# Patient Record
Sex: Female | Born: 1975 | Race: White | Hispanic: No | Marital: Married | Smoking: Never smoker
Health system: Southern US, Community
[De-identification: ages and names within clinical notes are randomized; demographics above are authoritative.]

## PROBLEM LIST (undated history)

## (undated) HISTORY — PX: NO PAST SURGERIES: SHX2092

## (undated) HISTORY — PX: RHINOPLASTY: SUR1284

## (undated) HISTORY — PX: AUGMENTATION MAMMAPLASTY: SUR837

## (undated) HISTORY — PX: BREAST ENHANCEMENT SURGERY: SHX7

---

## 2010-03-30 NOTE — L&D Delivery Note (Signed)
Delivery Note At 6:28 AM a viable female was delivered via Vaginal, Spontaneous Delivery (Presentation: Right Occiput Anterior).  APGAR: 9, 9; weight .   Placenta status: , .  Cord: 3 vessels with the following complications: None.  Cord pH: NONE  Anesthesia: Epidural  Episiotomy: None Lacerations: 2nd degree;Perineal Suture Repair: 2.0 Est. Blood Loss (mL): 400CC  Mom to postpartum.  Baby to nursery-stable.  Kenni Newton S 11/02/2010, 6:42 AM

## 2010-04-24 LAB — RPR: RPR: NONREACTIVE

## 2010-04-28 LAB — RUBELLA ANTIBODY, IGM: Rubella: IMMUNE

## 2010-04-28 LAB — HIV ANTIBODY (ROUTINE TESTING W REFLEX): HIV: NONREACTIVE

## 2010-04-28 LAB — ABO/RH: "RH Type ": POSITIVE

## 2010-04-28 LAB — SYPHILIS: RPR W/REFLEX TO RPR TITER AND TREPONEMAL ANTIBODIES, TRADITIONAL SCREENING AND DIAGNOSIS ALGORITHM: RPR: NONREACTIVE

## 2010-11-02 ENCOUNTER — Inpatient Hospital Stay (HOSPITAL_COMMUNITY)
Admission: AD | Admit: 2010-11-02 | Discharge: 2010-11-03 | DRG: 775 | Disposition: A | Discharge status: Home / Self Care | Payer: Managed Care, Other (non HMO) | Source: Ambulatory Visit | Attending: Obstetrics and Gynecology | Admitting: Obstetrics and Gynecology

## 2010-11-02 ENCOUNTER — Inpatient Hospital Stay (HOSPITAL_COMMUNITY): Payer: Managed Care, Other (non HMO) | Admitting: Anesthesiology

## 2010-11-02 ENCOUNTER — Encounter (HOSPITAL_COMMUNITY): Payer: Self-pay | Admitting: *Deleted

## 2010-11-02 ENCOUNTER — Encounter (HOSPITAL_COMMUNITY): Payer: Self-pay | Admitting: Anesthesiology

## 2010-11-02 LAB — CBC
HCT: 34.6 % — ABNORMAL LOW (ref 36.0–46.0)
Hemoglobin: 12.2 g/dL (ref 12.0–15.0)
MCV: 90.6 fL (ref 78.0–100.0)
Platelets: 134 10*3/uL — ABNORMAL LOW (ref 150–400)
RBC: 3.82 MIL/uL — ABNORMAL LOW (ref 3.87–5.11)
RDW: 12.4 % (ref 11.5–15.5)
WBC: 11.4 10*3/uL — ABNORMAL HIGH (ref 4.0–10.5)

## 2010-11-02 LAB — RPR: RPR Ser Ql: NONREACTIVE

## 2010-11-02 MED ORDER — IBUPROFEN 600 MG PO TABS: 600.0000 mg | ORAL_TABLET | Freq: Four times a day (QID) | ORAL | Status: DC | PRN

## 2010-11-02 MED ORDER — PHENYLEPHRINE 40 MCG/ML (10ML) SYRINGE FOR IV PUSH (FOR BLOOD PRESSURE SUPPORT)
PREFILLED_SYRINGE | INTRAVENOUS | Status: AC
  Filled 2010-11-02: qty 5

## 2010-11-02 MED ORDER — PRENATAL PLUS 27-1 MG PO TABS
1.0000 | ORAL_TABLET | Freq: Every day | ORAL | Status: DC
  Administered 2010-11-02 – 2010-11-03 (×2): 1 via ORAL
  Filled 2010-11-02 (×2): qty 1

## 2010-11-02 MED ORDER — FLEET ENEMA 7-19 GM/118ML RE ENEM: 1.0000 | ENEMA | RECTAL | Status: DC | PRN

## 2010-11-02 MED ORDER — LACTATED RINGERS IV SOLN
500.0000 mL | INTRAVENOUS | Status: DC | PRN
  Administered 2010-11-02: 1000 mL via INTRAVENOUS

## 2010-11-02 MED ORDER — FENTANYL 2.5 MCG/ML BUPIVACAINE 1/10 % EPIDURAL INFUSION (WH - ANES)
14.0000 mL/h | INTRAMUSCULAR | Status: DC
  Administered 2010-11-02: 14 mL/h via EPIDURAL

## 2010-11-02 MED ORDER — OXYTOCIN 20 UNITS IN LACTATED RINGERS INFUSION - SIMPLE: 125.0000 mL/h | INTRAVENOUS | Status: DC | PRN

## 2010-11-02 MED ORDER — TETANUS-DIPHTH-ACELL PERTUSSIS 5-2.5-18.5 LF-MCG/0.5 IM SUSP: 0.5000 mL | Freq: Once | INTRAMUSCULAR | Status: DC

## 2010-11-02 MED ORDER — PHENYLEPHRINE 40 MCG/ML (10ML) SYRINGE FOR IV PUSH (FOR BLOOD PRESSURE SUPPORT)
80.0000 ug | PREFILLED_SYRINGE | INTRAVENOUS | Status: DC | PRN
  Administered 2010-11-02: 40 ug via INTRAVENOUS

## 2010-11-02 MED ORDER — DIPHENHYDRAMINE HCL 25 MG PO CAPS: 25.0000 mg | ORAL_CAPSULE | Freq: Four times a day (QID) | ORAL | Status: DC | PRN

## 2010-11-02 MED ORDER — SIMETHICONE 80 MG PO CHEW: 80.0000 mg | CHEWABLE_TABLET | ORAL | Status: DC | PRN

## 2010-11-02 MED ORDER — ONDANSETRON HCL 4 MG PO TABS: 4.0000 mg | ORAL_TABLET | ORAL | Status: DC | PRN

## 2010-11-02 MED ORDER — WITCH HAZEL-GLYCERIN EX PADS: 1.0000 "application " | MEDICATED_PAD | CUTANEOUS | Status: DC | PRN

## 2010-11-02 MED ORDER — ZOLPIDEM TARTRATE 5 MG PO TABS: 5.0000 mg | ORAL_TABLET | Freq: Every evening | ORAL | Status: DC | PRN

## 2010-11-02 MED ORDER — OXYTOCIN 10 UNIT/ML IJ SOLN
20.0000 [IU] | Freq: Once | INTRAVENOUS | Status: AC
  Administered 2010-11-02: 20 [IU] via INTRAVENOUS
  Filled 2010-11-02: qty 2

## 2010-11-02 MED ORDER — LIDOCAINE HCL (PF) 1 % IJ SOLN: 30.0000 mL | INTRAMUSCULAR | Status: DC | PRN

## 2010-11-02 MED ORDER — BENZOCAINE-MENTHOL 20-0.5 % EX AERO
INHALATION_SPRAY | CUTANEOUS | Status: AC
  Filled 2010-11-02: qty 56

## 2010-11-02 MED ORDER — EPHEDRINE 5 MG/ML INJ: 10.0000 mg | INTRAVENOUS | Status: DC | PRN

## 2010-11-02 MED ORDER — DIBUCAINE 1 % RE OINT: 1.0000 "application " | TOPICAL_OINTMENT | RECTAL | Status: DC | PRN

## 2010-11-02 MED ORDER — BISACODYL 10 MG RE SUPP: 10.0000 mg | Freq: Every day | RECTAL | Status: DC | PRN

## 2010-11-02 MED ORDER — LIDOCAINE HCL 1.5 % IJ SOLN
INTRAMUSCULAR | Status: DC | PRN
  Administered 2010-11-02 (×2): 4 mL

## 2010-11-02 MED ORDER — BENZOCAINE-MENTHOL 20-0.5 % EX AERO: 1.0000 "application " | INHALATION_SPRAY | CUTANEOUS | Status: DC | PRN

## 2010-11-02 MED ORDER — DIPHENHYDRAMINE HCL 50 MG/ML IJ SOLN: 12.5000 mg | INTRAMUSCULAR | Status: DC | PRN

## 2010-11-02 MED ORDER — LACTATED RINGERS IV SOLN
INTRAVENOUS | Status: DC
  Administered 2010-11-02 (×2): via INTRAVENOUS

## 2010-11-02 MED ORDER — FENTANYL 2.5 MCG/ML BUPIVACAINE 1/10 % EPIDURAL INFUSION (WH - ANES)
INTRAMUSCULAR | Status: AC
  Filled 2010-11-02: qty 60

## 2010-11-02 MED ORDER — ACETAMINOPHEN 325 MG PO TABS: 650.0000 mg | ORAL_TABLET | ORAL | Status: DC | PRN

## 2010-11-02 MED ORDER — OXYTOCIN 20 UNITS IN LACTATED RINGERS INFUSION - SIMPLE
INTRAVENOUS | Status: AC
  Filled 2010-11-02: qty 1000

## 2010-11-02 MED ORDER — ONDANSETRON HCL 4 MG/2ML IJ SOLN: 4.0000 mg | Freq: Four times a day (QID) | INTRAMUSCULAR | Status: DC | PRN

## 2010-11-02 MED ORDER — OXYCODONE-ACETAMINOPHEN 5-325 MG PO TABS: 2.0000 | ORAL_TABLET | ORAL | Status: DC | PRN

## 2010-11-02 MED ORDER — LANOLIN HYDROUS EX OINT: TOPICAL_OINTMENT | CUTANEOUS | Status: DC | PRN

## 2010-11-02 MED ORDER — LACTATED RINGERS IV SOLN: 500.0000 mL | Freq: Once | INTRAVENOUS | Status: DC

## 2010-11-02 MED ORDER — OXYCODONE-ACETAMINOPHEN 5-325 MG PO TABS
1.0000 | ORAL_TABLET | ORAL | Status: DC | PRN
  Administered 2010-11-02 – 2010-11-03 (×3): 1 via ORAL
  Filled 2010-11-02 (×3): qty 1

## 2010-11-02 MED ORDER — OXYTOCIN 20 UNITS IN LACTATED RINGERS INFUSION - SIMPLE: 999.0000 mL/h | Freq: Once | INTRAVENOUS | Status: DC

## 2010-11-02 MED ORDER — OXYTOCIN 20 UNITS IN LACTATED RINGERS INFUSION - SIMPLE
125.0000 mL/h | Freq: Once | INTRAVENOUS | Status: DC
  Administered 2010-11-02: 999 mL/h via INTRAVENOUS
  Filled 2010-11-02: qty 1000

## 2010-11-02 MED ORDER — IBUPROFEN 600 MG PO TABS
600.0000 mg | ORAL_TABLET | Freq: Four times a day (QID) | ORAL | Status: DC
  Administered 2010-11-02 – 2010-11-03 (×4): 600 mg via ORAL
  Filled 2010-11-02 (×4): qty 1

## 2010-11-02 MED ORDER — SENNOSIDES-DOCUSATE SODIUM 8.6-50 MG PO TABS
2.0000 | ORAL_TABLET | Freq: Every day | ORAL | Status: DC
  Administered 2010-11-02: 2 via ORAL

## 2010-11-02 MED ORDER — ONDANSETRON HCL 4 MG/2ML IJ SOLN: 4.0000 mg | INTRAMUSCULAR | Status: DC | PRN

## 2010-11-02 MED ORDER — CITRIC ACID-SODIUM CITRATE 334-500 MG/5ML PO SOLN: 30.0000 mL | ORAL | Status: DC | PRN

## 2010-11-02 NOTE — Progress Notes (Signed)
Pt states, " I started having contractions at 0130 and they are 5 min apart."

## 2010-11-02 NOTE — Anesthesia Preprocedure Evaluation (Signed)
Anesthesia Evaluation  Name, MR# and DOB Patient awake  General Assessment Comment  Reviewed: Allergy & Precautions, H&P  and Patient's Chart, lab work & pertinent test results  Airway Mallampati: III TM Distance: >3 FB Neck ROM: full    Dental No notable dental hx (+) Teeth Intact   Pulmonaryneg pulmonary ROS    clear to auscultation  pulmonary exam normal   Cardiovascular regular Normal   Neuro/PsychNegative Neurological ROS Negative Psych ROS  GI/Hepatic/Renal negative GI ROS, negative Liver ROS, and negative Renal ROS (+)       Endo/Other  Negative Endocrine ROS (+)   Abdominal   Musculoskeletal  Hematology negative hematology ROS (+)   Peds  Reproductive/Obstetrics (+) Pregnancy   Anesthesia Other Findings             Anesthesia Physical Anesthesia Plan  ASA: II  Anesthesia Plan: Epidural   Post-op Pain Management:    Induction:   Airway Management Planned:   Additional Equipment:   Intra-op Plan:   Post-operative Plan:   Informed Consent: I have reviewed the patients History and Physical, chart, labs and discussed the procedure including the risks, benefits and alternatives for the proposed anesthesia with the patient or authorized representative who has indicated his/her understanding and acceptance.     Plan Discussed with: Anesthesiologist  Anesthesia Plan Comments:         Anesthesia Quick Evaluation  

## 2010-11-02 NOTE — H&P (Signed)
Danielle Russell is a 35 y.o. female gravida 3 para 2 at 43 weeks uncomplicated pnc.  Neg for gbs. Maternal Medical History:  Reason for admission: Reason for admission: rupture of membranes.  Contractions: Onset was 1-2 hours ago.   Frequency: regular.   Perceived severity is moderate.    Fetal activity: Perceived fetal activity is normal.    Prenatal Complications - Diabetes: none.    OB History    Grav Para Term Preterm Abortions TAB SAB Ect Mult Living   5 2 2  2  2   2      History reviewed. No pertinent past medical history. Past Surgical History  Procedure Date  . No past surgeries   . Breast enhancement surgery   . Rhinoplasty    Family History: family history is not on file. Social History:  reports that she has never smoked. She does not have any smokeless tobacco history on file. She reports that she does not drink alcohol or use illicit drugs.  ROS  Dilation: 10 Effacement (%): 100 Station: +3 Exam by:: Dr Arelia Sneddon Blood pressure 114/54, pulse 72, temperature 98.4 F (36.9 C), temperature source Oral, resp. rate 18, height 5\' 6"  (1.676 m), weight 166 lb 2 oz (75.354 kg), SpO2 100.00%. Maternal Exam:  Uterine Assessment: Contraction strength is moderate.  Contraction frequency is regular.   Abdomen: Patient reports no abdominal tenderness. Estimated fetal weight is 7 lbs.   Fetal presentation: vertex  Introitus: Amniotic fluid character: clear.  Pelvis: adequate for delivery.   Cervix: Cervix evaluated by digital exam.     Physical Exam  Prenatal labs: ABO, Rh:   Antibody: Negative (01/30 0000) Rubella:   RPR: Nonreactive (01/30 0000)  HBsAg: Negative (01/30 0000)  HIV: Non-reactive (01/30 0000)  GBS:     Assessment/Plan:PREG AT TERM WITH SROM.  ROUTINE L ADN D.   Danielle Russell S 11/02/2010, 6:39 AM

## 2010-11-02 NOTE — Anesthesia Procedure Notes (Signed)
Epidural Patient location during procedure: OB Start time: 11/02/2010 4:12 AM  Staffing Anesthesiologist: Star Resler A. Performed by: anesthesiologist   Preanesthetic Checklist Completed: patient identified, site marked, surgical consent, pre-op evaluation, timeout performed, IV checked, risks and benefits discussed and monitors and equipment checked  Epidural Patient position: sitting Prep: site prepped and draped and DuraPrep Patient monitoring: continuous pulse ox and blood pressure Approach: midline Injection technique: LOR air  Needle:  Needle type: Tuohy  Needle gauge: 17 G Needle length: 9 cm Needle insertion depth: 5 cm cm Catheter type: closed end flexible Catheter size: 19 Gauge Catheter at skin depth: 10 cm Test dose: negative and 1.5% lidocaine  Assessment Events: blood not aspirated, injection not painful, no injection resistance, negative IV test and no paresthesia  Additional Notes Patient is more comfortable after epidural dosed. Please see RN's note for documentation of vital signs and FHR which are stable.

## 2010-11-02 NOTE — Progress Notes (Signed)
Pt was awoken by contractions @ 0130, then she felt her water break.

## 2010-11-03 LAB — CBC
HCT: 28.4 % — ABNORMAL LOW (ref 36.0–46.0)
MCHC: 34.5 g/dL (ref 30.0–36.0)
RDW: 12.7 % (ref 11.5–15.5)

## 2010-11-03 MED ORDER — IBUPROFEN 600 MG PO TABS: 600.0000 mg | ORAL_TABLET | Freq: Four times a day (QID) | ORAL | Status: AC

## 2010-11-03 MED ORDER — OXYCODONE-ACETAMINOPHEN 5-325 MG PO TABS: 1.0000 | ORAL_TABLET | ORAL | Status: AC | PRN

## 2010-11-03 NOTE — Discharge Summary (Signed)
Obstetric Discharge Summary Reason for Admission: rupture of membranes Prenatal Procedures: none Intrapartum Procedures: spontaneous vaginal delivery Postpartum Procedures: none Complications-Operative and Postpartum: 2 degree perineal laceration Hemoglobin  Date Value Range Status  11/03/2010 9.8* 12.0-15.0 (g/dL) Final     DELTA CHECK NOTED     REPEATED TO VERIFY     HCT  Date Value Range Status  11/03/2010 28.4* 36.0-46.0 (%) Final    Discharge Diagnoses: Term Pregnancy-delivered  Discharge Information: Date: 11/03/2010 Activity: pelvic rest Diet: routine Medications: PNV, Ibuprophen and Percocet Condition: stable Instructions: refer to practice specific booklet Discharge to: home   Newborn Data: Live born female  Birth Weight: 7 lb 12.9 oz (3540 g) APGAR: 9, 9  Home with mother.  Danielle Russell G 11/03/2010, 8:18 AM

## 2010-11-03 NOTE — Anesthesia Postprocedure Evaluation (Signed)
  Anesthesia Post-op Note  Patient: Danielle Russell Anesthesia Post Note  Patient: Danielle Russell  Procedure(s) Performed: * No procedures listed *  Anesthesia type: Epidural  Patient location: Mother/Baby  Post pain: Pain level controlled  Post assessment: Post-op Vital signs reviewed  Last Vitals:  Filed Vitals:   11/03/10 0602  BP: 103/63  Pulse: 76  Temp: 98.5 F (36.9 C)  Resp: 18    Post vital signs: Reviewed  Level of consciousness: awake  Complications: No apparent anesthesia complications

## 2010-11-03 NOTE — Addendum Note (Signed)
Addendum  created 11/03/10 0947 by Rosalia Hammers   Modules edited:Charges VN

## 2010-11-03 NOTE — Progress Notes (Cosign Needed)
Post Partum Day 1 Subjective: no complaints  Objective: Blood pressure 103/63, pulse 76, temperature 98.5 F (36.9 C), temperature source Oral, resp. rate 18, height 5\' 6"  (1.676 m), weight 75.354 kg (166 lb 2 oz), SpO2 100.00%, unknown if currently breastfeeding.  Physical Exam:  General: alert and cooperative Lochia: appropriate Uterine Fundus: firm  DVT Evaluation: No evidence of DVT seen on physical exam.   Basename 11/03/10 0526 11/02/10 0300  HGB 9.8* 12.2  HCT 28.4* 34.6*    Assessment/Plan: Discharge home   LOS: 1 day   Danielle Russell G 11/03/2010, 8:14 AM

## 2010-11-03 NOTE — Anesthesia Postprocedure Evaluation (Signed)
Anesthesia Post Note  Patient: Danielle Russell  Procedure(s) Performed: * No procedures listed *  Anesthesia type: Epidural  Patient location: Mother/Baby  Post pain: Pain level controlled  Post assessment: Post-op Vital signs reviewed  Last Vitals:  Filed Vitals:   11/03/10 0602  BP: 103/63  Pulse: 76  Temp: 98.5 F (36.9 C)  Resp: 18    Post vital signs: Reviewed  Level of consciousness: awake  Complications: No apparent anesthesia complications

## 2010-11-03 NOTE — Progress Notes (Signed)
Encounter addended by: Rosalia Hammers on: 11/03/2010 12:36 PM<BR>     Documentation filed: Notes Section, Charges VN

## 2014-01-29 ENCOUNTER — Encounter (HOSPITAL_COMMUNITY): Payer: Self-pay | Admitting: *Deleted

## 2016-06-15 ENCOUNTER — Other Ambulatory Visit: Payer: Self-pay | Admitting: Obstetrics and Gynecology

## 2016-06-15 DIAGNOSIS — N644 Mastodynia: Secondary | ICD-10-CM

## 2016-06-18 ENCOUNTER — Other Ambulatory Visit: Payer: Managed Care, Other (non HMO)

## 2016-09-23 ENCOUNTER — Other Ambulatory Visit: Payer: Self-pay | Admitting: Internal Medicine

## 2016-09-23 DIAGNOSIS — R002 Palpitations: Secondary | ICD-10-CM

## 2016-09-24 ENCOUNTER — Other Ambulatory Visit: Payer: Self-pay

## 2016-09-24 ENCOUNTER — Ambulatory Visit (HOSPITAL_COMMUNITY): Payer: BLUE CROSS/BLUE SHIELD | Attending: Cardiology

## 2016-09-24 DIAGNOSIS — Z8249 Family history of ischemic heart disease and other diseases of the circulatory system: Secondary | ICD-10-CM | POA: Insufficient documentation

## 2016-09-24 DIAGNOSIS — R002 Palpitations: Secondary | ICD-10-CM

## 2016-09-24 DIAGNOSIS — I081 Rheumatic disorders of both mitral and tricuspid valves: Secondary | ICD-10-CM | POA: Insufficient documentation

## 2018-01-25 ENCOUNTER — Other Ambulatory Visit: Payer: Self-pay | Admitting: Neurosurgery

## 2018-01-25 DIAGNOSIS — M542 Cervicalgia: Secondary | ICD-10-CM

## 2018-01-27 ENCOUNTER — Ambulatory Visit: Payer: 59 | Admitting: Physical Therapy

## 2018-02-04 ENCOUNTER — Ambulatory Visit
Admission: RE | Admit: 2018-02-04 | Discharge: 2018-02-04 | Disposition: A | Discharge status: Home / Self Care | Payer: 59 | Source: Ambulatory Visit | Attending: Neurosurgery | Admitting: Neurosurgery

## 2018-02-04 DIAGNOSIS — M542 Cervicalgia: Secondary | ICD-10-CM

## 2018-02-04 MED ORDER — DIAZEPAM 5 MG PO TABS
10.0000 mg | ORAL_TABLET | Freq: Once | ORAL | Status: AC
  Administered 2018-02-04: 10 mg via ORAL

## 2018-02-04 MED ORDER — IOPAMIDOL (ISOVUE-M 300) INJECTION 61%
1.0000 mL | Freq: Once | INTRAMUSCULAR | Status: AC
  Administered 2018-02-04: 1 mL via EPIDURAL

## 2018-02-04 MED ORDER — TRIAMCINOLONE ACETONIDE 40 MG/ML IJ SUSP (RADIOLOGY)
60.0000 mg | Freq: Once | INTRAMUSCULAR | Status: AC
  Administered 2018-02-04: 60 mg via EPIDURAL

## 2018-02-04 NOTE — Discharge Instructions (Signed)

## 2019-07-24 ENCOUNTER — Other Ambulatory Visit: Payer: Self-pay | Admitting: Obstetrics and Gynecology

## 2019-07-24 DIAGNOSIS — R928 Other abnormal and inconclusive findings on diagnostic imaging of breast: Secondary | ICD-10-CM

## 2019-08-03 ENCOUNTER — Other Ambulatory Visit: Payer: Self-pay

## 2019-08-03 ENCOUNTER — Other Ambulatory Visit: Payer: Self-pay | Admitting: Obstetrics and Gynecology

## 2019-08-03 ENCOUNTER — Ambulatory Visit
Admission: RE | Admit: 2019-08-03 | Discharge: 2019-08-03 | Disposition: A | Discharge status: Home / Self Care | Payer: 59 | Source: Ambulatory Visit | Attending: Obstetrics and Gynecology | Admitting: Obstetrics and Gynecology

## 2019-08-03 DIAGNOSIS — R928 Other abnormal and inconclusive findings on diagnostic imaging of breast: Secondary | ICD-10-CM

## 2020-02-14 IMAGING — XA DG INJECT/[PERSON_NAME] INC NEEDLE/CATH/PLC EPI/CERV/THOR W/IMG
2 series · 2 of 2 positions shown · non-contrast
Comparison: none

CLINICAL DATA: Cervical spondylosis. Neck pain. Symptoms worse on
the LEFT.

[Series 1: ortho standard · 1 of 1 slices shown (1 of 2)]
[im 1/1]
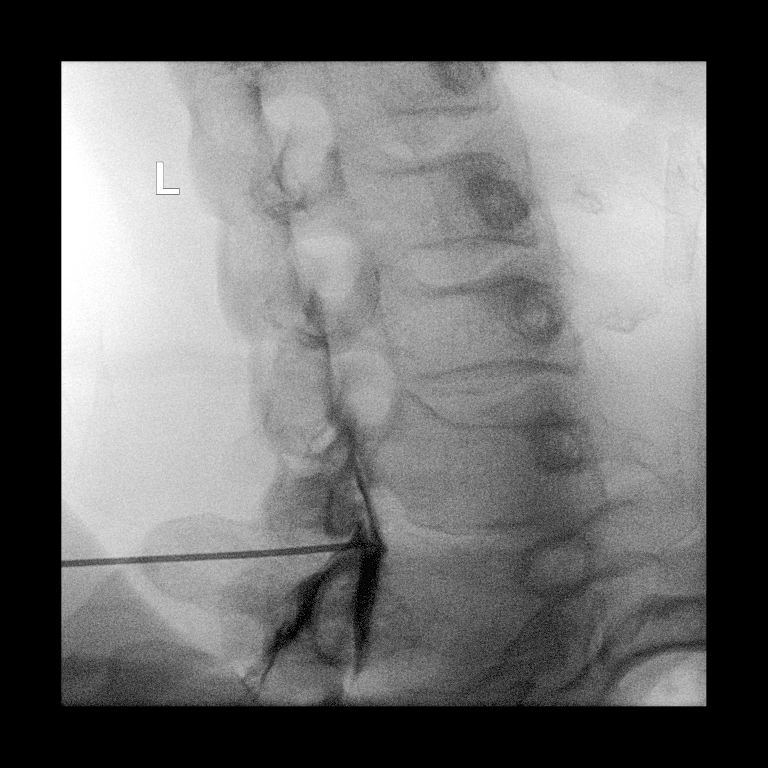

[Series 2: ortho standard · 1 of 1 slices shown (2 of 2)]
[im 1/1]
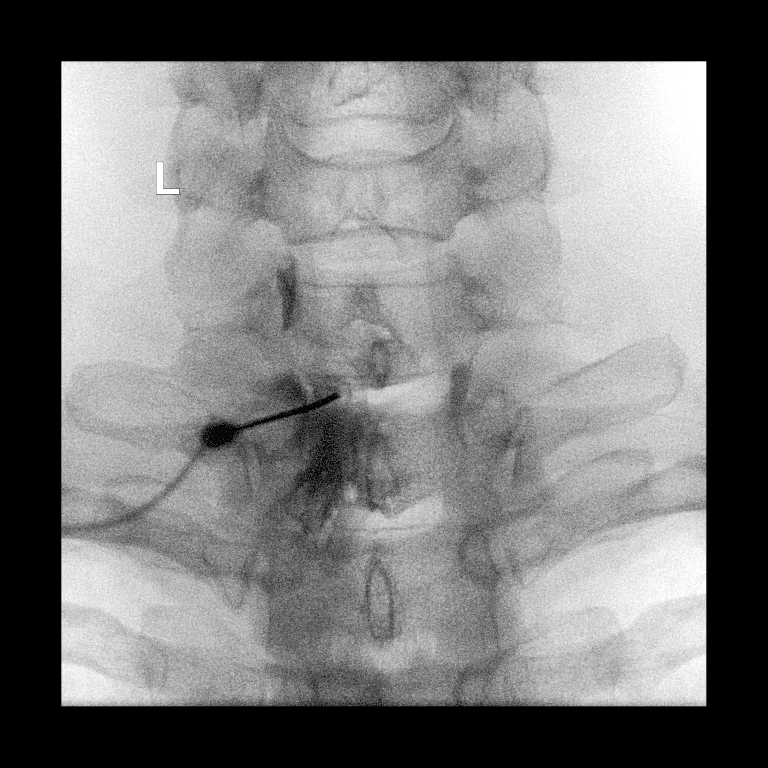

[2 of 2 positions shown; findings below may reference images not displayed]

FLUOROSCOPY TIME:  27 seconds corresponding to a Dose Area Product
of 5.36 Gy*m2

PROCEDURE:
Informed written consent was obtained. Time-out was performed.
Patient was given Valium preprocedure, as outlined in the nurse's
notes, for anxiety. She tolerated the procedure well.

An appropriate skin entry site was chosen, cleansed with Betadine,
and anesthetized with 1% lidocaine.

CERVICAL EPIDURAL INJECTION

An interlaminar approach was performed on the LEFT at C7-T1. A 20
gauge epidural needle was advanced using loss-of-resistance
technique.

DIAGNOSTIC EPIDURAL INJECTION

Injection of Isovue-M 300 shows a good epidural pattern with spread
above and below the level of needle placement, primarily on the
LEFT. No vascular opacification is seen. THERAPEUTIC

EPIDURAL INJECTION

1.5 ml of Kenalog 40 mixed with 1 ml of 1% Lidocaine and 2 ml of
normal saline were then instilled. The procedure was well-tolerated,
and the patient was discharged thirty minutes following the
injection in good condition.
IMPRESSION: Technically successful first epidural injection on the LEFT at
C7-T1.

## 2021-09-25 ENCOUNTER — Other Ambulatory Visit (HOSPITAL_COMMUNITY): Payer: Self-pay | Admitting: Orthopedic Surgery

## 2021-09-25 DIAGNOSIS — M79604 Pain in right leg: Secondary | ICD-10-CM

## 2021-09-26 ENCOUNTER — Ambulatory Visit (HOSPITAL_COMMUNITY)
Admission: RE | Admit: 2021-09-26 | Discharge: 2021-09-26 | Disposition: A | Discharge status: Home / Self Care | Payer: BC Managed Care – PPO | Source: Ambulatory Visit | Attending: Orthopedic Surgery | Admitting: Orthopedic Surgery

## 2021-09-26 DIAGNOSIS — M7989 Other specified soft tissue disorders: Secondary | ICD-10-CM | POA: Diagnosis present

## 2021-09-26 DIAGNOSIS — M79604 Pain in right leg: Secondary | ICD-10-CM | POA: Diagnosis present
